# Patient Record
Sex: Female | Born: 1985 | Race: White | Hispanic: No | Marital: Single | Smoking: Current every day smoker
Health system: Southern US, Community
[De-identification: ages and names within clinical notes are randomized; demographics above are authoritative.]

---
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
## 2006-05-21 ENCOUNTER — Emergency Department: Payer: Self-pay | Admitting: Emergency Medicine

## 2006-10-03 ENCOUNTER — Observation Stay: Payer: Self-pay

## 2006-10-24 ENCOUNTER — Observation Stay: Payer: Self-pay | Admitting: Unknown Physician Specialty

## 2006-12-15 ENCOUNTER — Observation Stay: Payer: Self-pay

## 2006-12-26 ENCOUNTER — Inpatient Hospital Stay: Payer: Self-pay

## 2008-02-23 ENCOUNTER — Emergency Department: Payer: Self-pay | Admitting: Emergency Medicine

## 2009-06-24 ENCOUNTER — Emergency Department: Payer: Self-pay | Admitting: Emergency Medicine

## 2009-06-25 ENCOUNTER — Emergency Department: Payer: Self-pay | Admitting: Emergency Medicine

## 2009-12-17 ENCOUNTER — Emergency Department: Payer: Self-pay | Admitting: Emergency Medicine

## 2010-04-25 ENCOUNTER — Emergency Department: Payer: Self-pay | Admitting: Emergency Medicine

## 2010-04-26 ENCOUNTER — Emergency Department: Payer: Self-pay | Admitting: Unknown Physician Specialty

## 2010-07-23 ENCOUNTER — Ambulatory Visit: Payer: Self-pay | Admitting: Specialist

## 2012-01-19 ENCOUNTER — Emergency Department: Payer: Self-pay | Admitting: Emergency Medicine

## 2012-01-19 LAB — URINALYSIS, COMPLETE
Bilirubin,UR: NEGATIVE
Nitrite: NEGATIVE
Protein: NEGATIVE
Specific Gravity: 1.005 (ref 1.003–1.030)
Squamous Epithelial: 1
WBC UR: <1 /HPF (ref 0–5)

## 2012-01-19 LAB — CBC
HCT: 42.4 % (ref 35.0–47.0)
MCH: 31.6 pg (ref 26.0–34.0)
MCV: 92 fL (ref 80–100)
Platelet: 242 10*3/uL (ref 150–440)
RDW: 12.7 % (ref 11.5–14.5)

## 2012-01-19 LAB — HCG, QUANTITATIVE, PREGNANCY: Beta Hcg, Quant.: 3061 m[IU]/mL — ABNORMAL HIGH

## 2012-01-19 LAB — WET PREP, GENITAL

## 2012-01-20 ENCOUNTER — Emergency Department: Payer: Self-pay | Admitting: Emergency Medicine

## 2012-01-26 ENCOUNTER — Emergency Department: Payer: Self-pay | Admitting: Emergency Medicine

## 2012-01-26 LAB — URINALYSIS, COMPLETE
Bilirubin,UR: NEGATIVE
Blood: NEGATIVE
Ketone: NEGATIVE
Leukocyte Esterase: NEGATIVE
Nitrite: NEGATIVE
Ph: 7 (ref 4.5–8.0)
Specific Gravity: 1.012 (ref 1.003–1.030)

## 2012-01-26 LAB — COMPREHENSIVE METABOLIC PANEL
Albumin: 4.1 g/dL (ref 3.4–5.0)
Alkaline Phosphatase: 59 U/L (ref 50–136)
Anion Gap: 8 (ref 7–16)
Bilirubin,Total: 0.7 mg/dL (ref 0.2–1.0)
Chloride: 108 mmol/L — ABNORMAL HIGH (ref 98–107)
Co2: 24 mmol/L (ref 21–32)
Creatinine: 0.49 mg/dL — ABNORMAL LOW (ref 0.60–1.30)
EGFR (African American): >60
EGFR (Non-African Amer.): >60
Glucose: 86 mg/dL (ref 65–99)
Potassium: 3.8 mmol/L (ref 3.5–5.1)
Sodium: 140 mmol/L (ref 136–145)

## 2012-01-26 LAB — CBC
HCT: 41.4 % (ref 35.0–47.0)
HGB: 14.4 g/dL (ref 12.0–16.0)
MCH: 32.1 pg (ref 26.0–34.0)
MCHC: 34.7 g/dL (ref 32.0–36.0)
Platelet: 218 10*3/uL (ref 150–440)
RBC: 4.49 10*6/uL (ref 3.80–5.20)

## 2012-01-26 LAB — HCG, QUANTITATIVE, PREGNANCY: Beta Hcg, Quant.: 10971 m[IU]/mL — ABNORMAL HIGH

## 2012-01-26 LAB — WET PREP, GENITAL

## 2012-04-13 ENCOUNTER — Encounter: Payer: Self-pay | Admitting: Maternal & Fetal Medicine

## 2012-05-09 ENCOUNTER — Encounter: Payer: Self-pay | Admitting: Pediatric Cardiology

## 2012-06-05 ENCOUNTER — Encounter: Payer: Self-pay | Admitting: Maternal & Fetal Medicine

## 2012-07-17 ENCOUNTER — Encounter: Payer: Self-pay | Admitting: Obstetrics & Gynecology

## 2012-08-08 ENCOUNTER — Emergency Department: Payer: Self-pay | Admitting: Emergency Medicine

## 2012-08-21 ENCOUNTER — Encounter: Payer: Self-pay | Admitting: Maternal and Fetal Medicine

## 2012-08-31 ENCOUNTER — Inpatient Hospital Stay: Payer: Self-pay | Admitting: Obstetrics and Gynecology

## 2012-08-31 LAB — PIH PROFILE
Anion Gap: 9 (ref 7–16)
BUN: 9 mg/dL (ref 7–18)
Calcium, Total: 8.5 mg/dL (ref 8.5–10.1)
Chloride: 110 mmol/L — ABNORMAL HIGH (ref 98–107)
Co2: 19 mmol/L — ABNORMAL LOW (ref 21–32)
HCT: 33.9 % — ABNORMAL LOW (ref 35.0–47.0)
MCH: 29.2 pg (ref 26.0–34.0)
MCV: 84 fL (ref 80–100)
Osmolality: 273 (ref 275–301)
Platelet: 177 10*3/uL (ref 150–440)
Potassium: 3.8 mmol/L (ref 3.5–5.1)
RDW: 13.9 % (ref 11.5–14.5)
SGOT(AST): 21 U/L (ref 15–37)
Sodium: 138 mmol/L (ref 136–145)
Uric Acid: 4.4 mg/dL (ref 2.6–6.0)
WBC: 8.3 10*3/uL (ref 3.6–11.0)

## 2012-08-31 LAB — PROTEIN / CREATININE RATIO, URINE
Protein, Random Urine: 55 mg/dL — ABNORMAL HIGH (ref 0–12)
Protein/Creat. Ratio: 1368 mg/gCREAT — ABNORMAL HIGH (ref 0–200)

## 2012-09-01 LAB — PLATELET COUNT: Platelet: 164 10*3/uL (ref 150–440)

## 2012-09-02 LAB — HEMATOCRIT
HCT: 25.6 % — ABNORMAL LOW (ref 35.0–47.0)
HCT: 24.4 % — ABNORMAL LOW (ref 35.0–47.0)

## 2012-09-02 LAB — HEMOGLOBIN: HGB: 8.7 g/dL — ABNORMAL LOW (ref 12.0–16.0)

## 2012-09-04 LAB — PATHOLOGY REPORT

## 2012-09-07 ENCOUNTER — Observation Stay: Payer: Self-pay | Admitting: Obstetrics and Gynecology

## 2012-09-07 LAB — URINALYSIS, COMPLETE
Bacteria: NONE SEEN
Glucose,UR: >=500 mg/dL (ref 0–75)
Ketone: NEGATIVE
Leukocyte Esterase: NEGATIVE
Nitrite: NEGATIVE
Ph: 6 (ref 4.5–8.0)
Specific Gravity: 1.015 (ref 1.003–1.030)
Squamous Epithelial: <1

## 2012-09-07 LAB — COMPREHENSIVE METABOLIC PANEL
Alkaline Phosphatase: 154 U/L — ABNORMAL HIGH (ref 50–136)
Anion Gap: 11 (ref 7–16)
BUN: 11 mg/dL (ref 7–18)
Bilirubin,Total: 0.4 mg/dL (ref 0.2–1.0)
Chloride: 105 mmol/L (ref 98–107)
Co2: 20 mmol/L — ABNORMAL LOW (ref 21–32)
Creatinine: 0.46 mg/dL — ABNORMAL LOW (ref 0.60–1.30)
SGPT (ALT): 74 U/L (ref 12–78)
Total Protein: 7.9 g/dL (ref 6.4–8.2)

## 2012-09-07 LAB — CBC
HCT: 32 % — ABNORMAL LOW (ref 35.0–47.0)
HGB: 11 g/dL — ABNORMAL LOW (ref 12.0–16.0)
MCH: 28.8 pg (ref 26.0–34.0)
MCV: 84 fL (ref 80–100)
Platelet: 336 10*3/uL (ref 150–440)
RBC: 3.81 10*6/uL (ref 3.80–5.20)
RDW: 15.7 % — ABNORMAL HIGH (ref 11.5–14.5)
WBC: 9.1 10*3/uL (ref 3.6–11.0)

## 2014-12-31 NOTE — Op Note (Signed)
PATIENT NAME:  Theresa Hodges, Theresa Hodges MR#:  [id number] DATE OF BIRTH:  24-Jul-1986  DATE OF PROCEDURE:  09/01/2012  PREOPERATIVE DIAGNOSIS: Postpartum hemorrhage.  POSTOPERATIVE DIAGNOSIS: Postpartum hemorrhage, retained placenta.   PROCEDURE: Dilatation and curettage.   SURGEON: Senaida LangeLashawn Weaver-Lee, MD   ANESTHESIA: General.   ESTIMATED BLOOD LOSS: 500 mL.  OPERATIVE FLUIDS: 800 mL, 1 unit of packed red blood cells running.   COMPLICATIONS: None.   FINDINGS: As above.   SPECIMEN: Products of conception.   INDICATIONS: The patient is a 29 year old who is status post vaginal delivery with manual removal of placenta, who had postpartum hemorrhage shortly after this. She received Cytotec as well as Hemabate without improvement in her bleeding; therefore, it was decided to perform D and C. The risks, benefits, indications, and alternatives of the procedure were explained and informed consent was obtained.   PROCEDURE: The patient was taken to the operating room with IV fluids running. She was prepped and draped in the usual sterile fashion in candy cane stirrups. A speculum was placed inside the vagina. The anterior lip of the cervix was grasped with a single-tooth tenaculum. The cervix was evaluated for possible cervical lacerations, and none were noted. Her uterus was gently curettaged using both straight and serrated edge banjo curettes,  at which time placental tissue was removed. Once the bleeding decreased, her fundus was manually massaged and it was not seen to have a lot of bleeding nor clot. The procedure was ended at this point. All instrumentation was removed from the patient's vagina. Sponge and instrument counts were correct x 2. The patient was awakened from anesthesia and taken to the recovery room in stable condition.   ____________________________ Sonda PrimesLashawn A. Patton SallesWeaver-Lee, MD law:cb D: 09/01/2012 09:59:14 ET T: 09/01/2012 15:31:49 ET JOB#: [id number]  cc: Flint MelterLashawn A. Patton SallesWeaver-Lee, MD,  <Dictator> Janyth ContesLASHAWN A WEAVER LEE MD ELECTRONICALLY SIGNED 09/01/2012 19:14

## 2015-01-21 NOTE — H&P (Signed)
L&D Evaluation:  History:   HPI 29 yo G3 P3003 who is postpartum day #6 s/p NSVD.  She delivered at 7479w0d after being induced for mild preeclampsia.  Her delivery was complicated by a post-partum hemorrhage requiring D&C, as well as acute blood loss anemia requiring transfusion of 2 units of packed red blood cells and 2 units of fresh frozen plasma. She was discharged on postpartum day #2 on labetalol 200mg  po bid.   She presents today after having had a headache for the past couple of days that is unresponsive to any home treatments (she is taking percocet at home).  She denies taking blood pressure medicine at home.  She denies visual changes and right upper quadrant pain.  She has also had nausea and emesis since last night.    Patient's Medical History Herpes Simplex Virus    Patient's Surgical History LEEP    Medications Pre Natal Vitamins  Motrin (Ibuprofen)  percocet    Allergies Ultram    Social History none    Family History Non-Contributory   ROS:   ROS All systems were reviewed.  HEENT, CNS, GI, GU, Respiratory, CV, Renal and Musculoskeletal systems were found to be normal., unless noted in HPI   Exam:   Vital Signs BP >140/90  range in ER from SBP 140-170s/DBP 70s-90s    Urine Protein 2+    General mild-moderate distress    Mental Status clear    Chest clear    Heart normal sinus rhythm    Abdomen gravid, non-tender    Back no CVAT    Edema no edema    Reflexes 2+    Skin no lesions    Other HELLP labs negative   Impression:   Impression Preeclampsia, severe now delivered with worsening symptoms   Plan:   Comments Will admit for a short course of magnesium and observation.  Per her discharge instructions, she was discharged on labetalol and now states she is taking nothing.  She does have a headache refractory to treatment with BPs now in the 170s range.  Given these new findings, which suggest a severe form of disease will give neuroprotection for a  short course and then monitor in-house on labetalol.   Electronic Signatures: Conard NovakJackson, Keyston Ardolino D (MD)  (Signed 26-Dec-13 17:28)  Authored: L&D Evaluation   Last Updated: 26-Dec-13 17:28 by Conard NovakJackson, Wylie Coon D (MD)

## 2015-01-21 NOTE — H&P (Signed)
L&D Evaluation:  History:   HPI 29 yo G3 P2002 at 5624w0d gestational age by early 1st trimester ultrasound.  Pregnancy has been complicated by Rh neg status, history of LEEP, abnormal AFP screen with elevated Trisomy 21 risk (1:8).  She declined amniocentesis and cell-free fetal DNA testing.  Her fetus has had a normal echocardiogram. She has had serial growth ultrasounds by [facility], which have been normal.  She initially received her care at [facility]. [facility] at about [redacted] weeks gestation for new-onset elevated BPs for which she was started on labetalol.  Her BPs have been in hte normal range and she has had negative proteinuria in clinic until today.  Today in clinic she presented with BP 150/100 and 2+ proteinuria.  She was sent to L&D for further evaluation.  She denies HA, visual disturbances, RUQ pain.  She notes positive fetal movement, denies vaginal bleeding, contractions, and leakage of fluid. Blood type A neg, RPR NR, RI, HBsAg neg, pap smear = ASCUS w/ HPV neg, GBS POS, VZNI, she declined TDAP this pregnancy.    Patient's Medical History Herpes simplex virus    Patient's Surgical History LEEP    Medications labetalol 100mg  PO BID    Allergies Ultram    Social History none    Family History Non-Contributory   Exam:   Vital Signs SBPs 135-159 (all but one >140), DBPs 79-91    Urine Protein UPC ratio 1368    General no apparent distress    Mental Status clear    Chest clear    Heart normal sinus rhythm    Abdomen gravid, non-tender    Estimated Fetal Weight 6 lbs    Fetal Position vertex by bedside ultrasound performed by me    Back no CVAT    Edema 1+    Reflexes 2+    Pelvic no external lesions, 2/50/-3    Mebranes Intact    FHT normal rate with no decels    FHT Description 130/mod var/+accels/ no decels    Ucx irregular    Other HELLP Labs all within normal laboratory range   Impression:   Impression 1) Intrauterine  pregnancy at early term (37 wks), 2) mild preeclampsia   Plan:   Plan EFM/NST, antibiotics for GBBS prophylaxis, fluids    Comments - admit for induction for mild preeclampsia - magnesium sulfate in labor and 12-24 hours postpartum - discussed with patient. She is in agreement.   Electronic Signatures: Conard NovakJackson, Juliannah Ohmann D (MD)  (Signed 19-Dec-13 19:14)  Authored: L&D Evaluation   Last Updated: 19-Dec-13 19:14 by Conard NovakJackson, Bayli Quesinberry D (MD)

## 2015-11-14 ENCOUNTER — Ambulatory Visit
Admission: RE | Admit: 2015-11-14 | Discharge: 2015-11-14 | Disposition: A | Discharge status: Home / Self Care | Payer: 59 | Source: Ambulatory Visit | Attending: Family Medicine | Admitting: Family Medicine

## 2015-11-14 ENCOUNTER — Other Ambulatory Visit: Payer: Self-pay | Admitting: Family Medicine

## 2015-11-14 DIAGNOSIS — R1011 Right upper quadrant pain: Secondary | ICD-10-CM | POA: Diagnosis not present

## 2016-03-13 ENCOUNTER — Encounter: Payer: Self-pay | Admitting: Emergency Medicine

## 2016-03-13 ENCOUNTER — Emergency Department
Admission: EM | Admit: 2016-03-13 | Discharge: 2016-03-13 | Disposition: A | Discharge status: Home / Self Care | Payer: 59 | Attending: Emergency Medicine | Admitting: Emergency Medicine

## 2016-03-13 ENCOUNTER — Emergency Department: Payer: 59

## 2016-03-13 DIAGNOSIS — G43909 Migraine, unspecified, not intractable, without status migrainosus: Secondary | ICD-10-CM | POA: Diagnosis present

## 2016-03-13 DIAGNOSIS — F17210 Nicotine dependence, cigarettes, uncomplicated: Secondary | ICD-10-CM | POA: Diagnosis not present

## 2016-03-13 MED ORDER — KETOROLAC TROMETHAMINE 30 MG/ML IJ SOLN
30.0000 mg | Freq: Once | INTRAMUSCULAR | Status: AC
  Administered 2016-03-13: 30 mg via INTRAVENOUS
  Filled 2016-03-13: qty 1

## 2016-03-13 MED ORDER — METOCLOPRAMIDE HCL 5 MG/ML IJ SOLN
20.0000 mg | Freq: Once | INTRAMUSCULAR | Status: AC
  Administered 2016-03-13: 20 mg via INTRAVENOUS
  Filled 2016-03-13: qty 4

## 2016-03-13 MED ORDER — DIPHENHYDRAMINE HCL 50 MG/ML IJ SOLN
25.0000 mg | Freq: Once | INTRAMUSCULAR | Status: AC
  Administered 2016-03-13: 25 mg via INTRAVENOUS
  Filled 2016-03-13: qty 1

## 2016-03-13 MED ORDER — SODIUM CHLORIDE 0.9 % IV SOLN
1000.0000 mL | Freq: Once | INTRAVENOUS | Status: AC
  Administered 2016-03-13: 1000 mL via INTRAVENOUS

## 2016-03-13 MED ORDER — BUTALBITAL-APAP-CAFFEINE 50-325-40 MG PO TABS: 1.0000 | ORAL_TABLET | Freq: Four times a day (QID) | ORAL | Status: AC | PRN

## 2016-03-13 NOTE — ED Notes (Signed)
Pt. Going home with friend 

## 2016-03-13 NOTE — ED Provider Notes (Signed)
[facility] Emergency Department Provider Note  ____________________________________________    I have reviewed the triage vital signs and the nursing notes.   HISTORY  Chief Complaint Emesis    HPI Theresa Hodges is a 30 y.o. female who presents with complaints of a headache. Patient reports the headache started gradually at approximately noon today. It steadily became worse. She reports that when was on its worse she felt intermittent tingling in her distal left upper extremity. No weakness. She also notes that prior to the headache she noticed an area of bright lights in her right eye which she has never had before. She does not have history of migraines. No head trauma. No blood thinners. No fevers chills nausea vomiting. No recent travel. No neck pain.Positive photophobia     History reviewed. No pertinent past medical history.  There are no active problems to display for this patient.   History reviewed. No pertinent past surgical history.  No current outpatient prescriptions on file.  Allergies Review of patient's allergies indicates no known allergies.  History reviewed. No pertinent family history.  Social History Social History  Substance Use Topics  . Smoking status: Current Every Day Smoker -- 0.25 packs/day    Types: Cigarettes  . Smokeless tobacco: None  . Alcohol Use: No    Review of Systems  Constitutional: Negative for fever. Eyes: Negative for redness ENT: Negative for sore throat Cardiovascular: Negative for chest pain Respiratory: Negative for shortness of breath. Gastrointestinal: Negative for abdominal pain Genitourinary: Negative for dysuria. Musculoskeletal: Negative for back pain. Skin: Negative for rash. Neurological: Negative for focal weakness, Positive for headache Psychiatric: no anxiety    ____________________________________________   PHYSICAL EXAM:  VITAL SIGNS: ED Triage Vitals  Enc Vitals  Group     BP 03/13/16 1428 135/73 mmHg     Pulse Rate 03/13/16 1428 90     Resp 03/13/16 1428 18     Temp 03/13/16 1428 97.5 F (36.4 C)     Temp Source 03/13/16 1428 Oral     SpO2 03/13/16 1428 100 %     Weight 03/13/16 1428 180 lb (81.647 kg)     Height 03/13/16 1428 5\' 5"  (1.651 m)     Head Cir --      Peak Flow --      Pain Score 03/13/16 1428 8     Pain Loc --      Pain Edu? --      Excl. in GC? --      Constitutional: Alert and oriented. Well appearing and in no distress.  Eyes: Conjunctivae are normal. No erythema or injection, PERRLA, EOMI ENT   Head: Normocephalic and atraumatic.   Mouth/Throat: Mucous membranes are moist. Cardiovascular: Normal rate, regular rhythm. Normal and symmetric distal pulses are present in the upper extremities.  Respiratory: Normal respiratory effort without tachypnea nor retractions. Breath sounds are clear and equal bilaterally.  Gastrointestinal: Soft and non-tender in all quadrants. No distention. There is no CVA tenderness. Genitourinary: deferred Musculoskeletal: Nontender with normal range of motion in all extremities. No lower extremity tenderness nor edema. Neurologic:  Normal speech and language. No gross focal neurologic deficits are appreciated. Cranial nerves II-12 are normal Skin:  Skin is warm, dry and intact. No rash noted. Psychiatric: Mood and affect are normal. Patient exhibits appropriate insight and judgment.  ____________________________________________    LABS (pertinent positives/negatives)  Labs Reviewed - No data to display  ____________________________________________   EKG  None  ____________________________________________  RADIOLOGY  CT head unremarkable  ____________________________________________   PROCEDURES  Procedure(s) performed: none  Critical Care performed: none  ____________________________________________   INITIAL IMPRESSION / ASSESSMENT AND PLAN / ED  COURSE  Pertinent labs & imaging results that were available during my care of the patient were reviewed by me and considered in my medical decision making (see chart for details).  Patient's presentation is strongly consistent with likely migraine given description of possible visual aura, photophobia, nausea. She has no fevers or chills. We will obtain CT head and give migraine cocktail. She reports she is not pregnant as recent menstrual cycle.  ----------------------------------------- 7:20 PM on 03/13/2016 -----------------------------------------  I went to check on the patient and she had gotten dressed and was sitting on the bench watching TV, she reports she feels "completely better". We discussed the possible causes of her headache, and my suspicion of a migraine given her age, likely aura and photophobia. We did discuss other options and the need for prompt return if  new symptoms. I have referred her to follow up with neurology. She is very comfortable with and agrees with this plan  ____________________________________________   FINAL CLINICAL IMPRESSION(S) / ED DIAGNOSES  Final diagnoses:  Migraine without status migrainosus, not intractable, unspecified migraine type          Jene Everyobert Dazaria Macneill, MD 03/13/16 Ernestina [id number]

## 2016-03-13 NOTE — ED Notes (Signed)
Pt. States intermittent numbness to lower upper lt. Extremity.  Pt. Currently states lt. Arm feels ok.

## 2016-03-13 NOTE — ED Notes (Signed)
Pt reports vomiting while at work x4. Prior to vomiting episodes patient states her left arm was numb off and on.

## 2016-03-13 NOTE — Discharge Instructions (Signed)

## 2017-05-06 IMAGING — CT CT HEAD W/O CM
3 series · 16 of 47 positions shown, 19 images · non-contrast
Comparison: None.

CLINICAL DATA: Headaches

EXAM:
CT HEAD WITHOUT CONTRAST
TECHNIQUE: Contiguous axial images were obtained from the base of the skull
through the vertex without intravenous contrast.

[Series 2: head wo · axial · 0.47mm/px · z∈[-190,-66]mm · 10 of 31 slices shown, 13 images]
[im 3/31  brain]
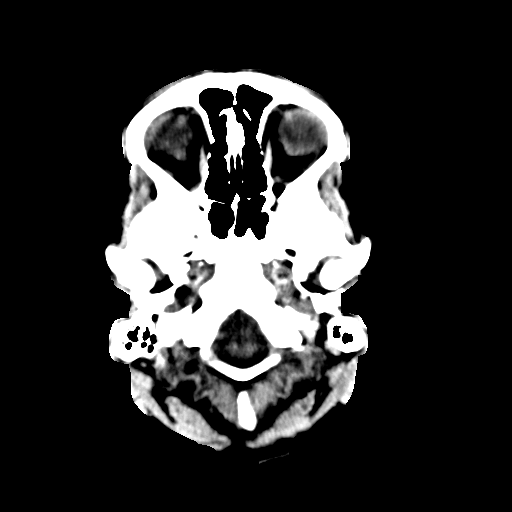
[im 3/31  bone]
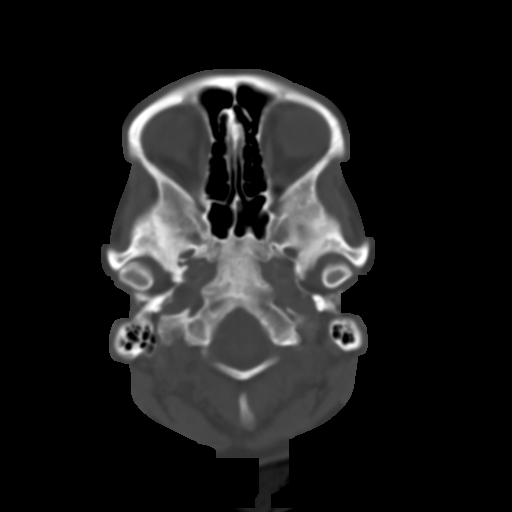
[im 6/31  brain]
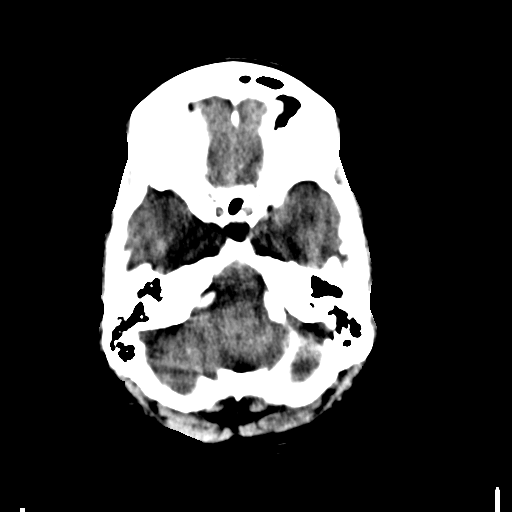
[im 9/31  brain]
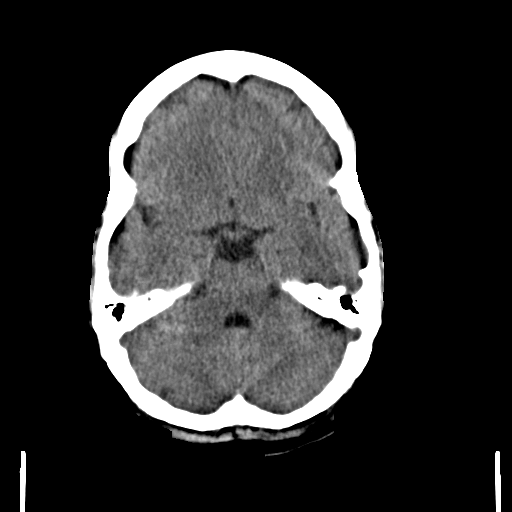
[im 11/31  brain]
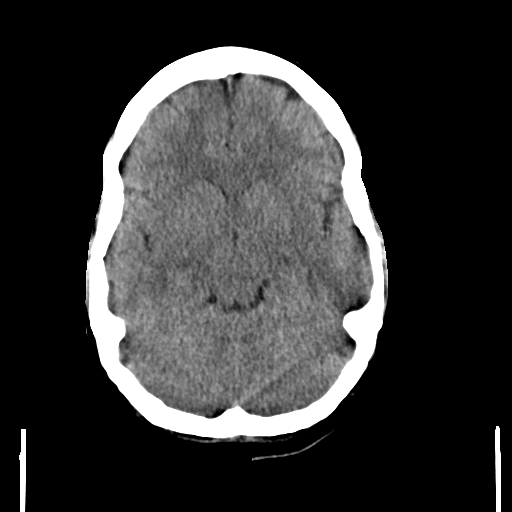
[im 14/31  brain]
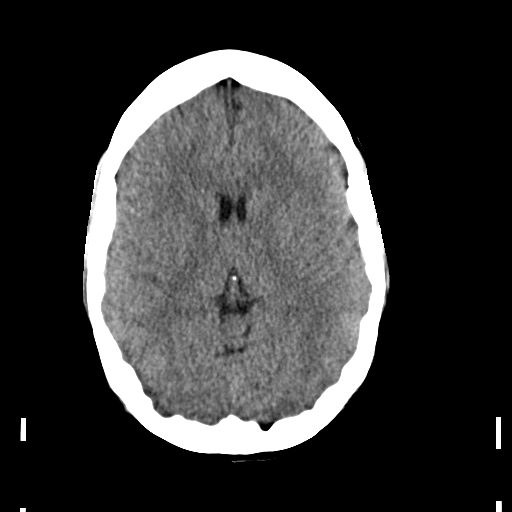
[im 14/31  bone]
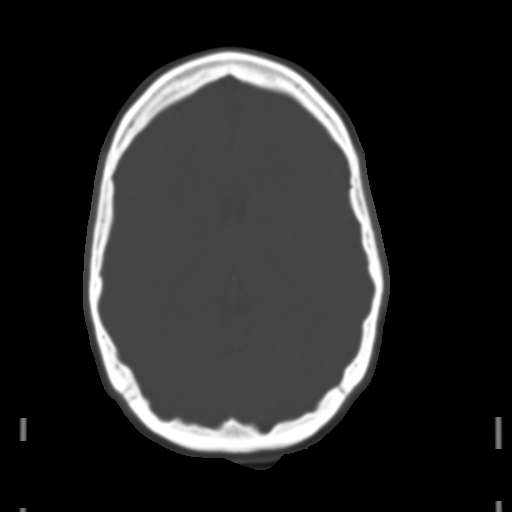
[im 17/31  brain]
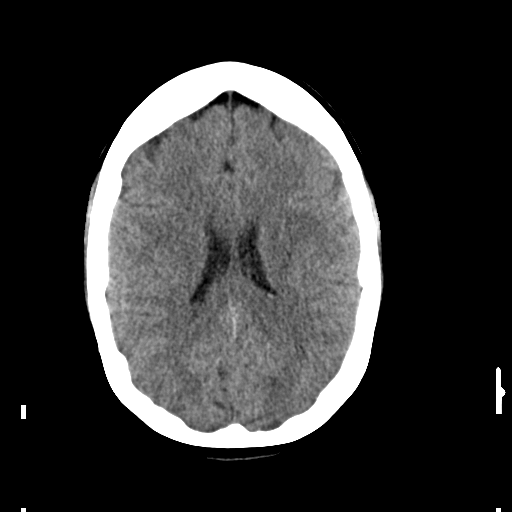
[im 20/31  brain]
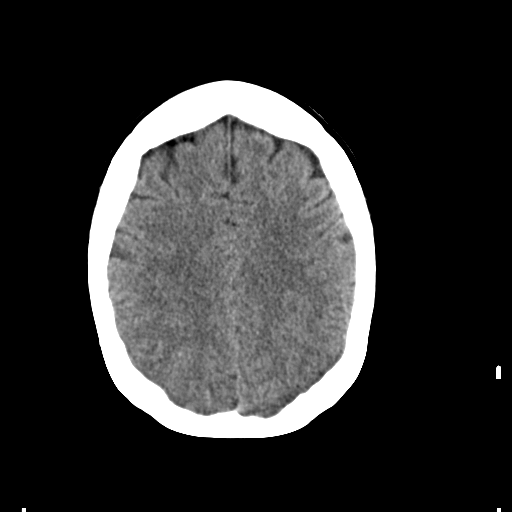
[im 23/31  brain]
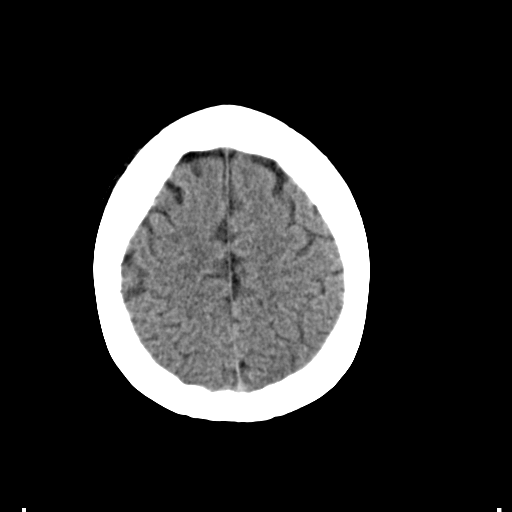
[im 25/31  brain]
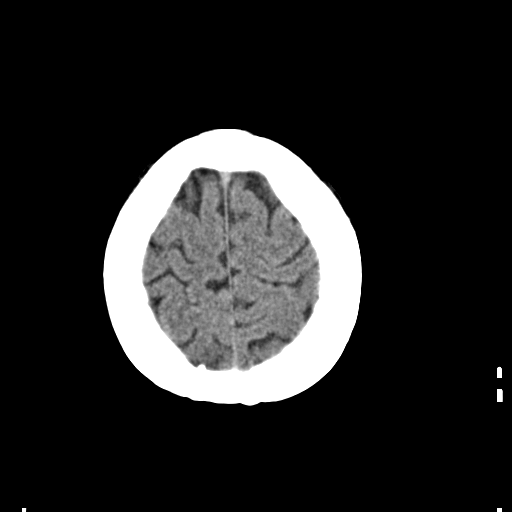
[im 25/31  bone]
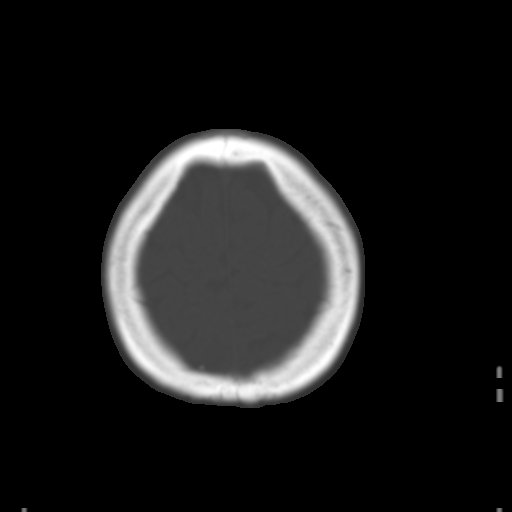
[im 28/31  brain]
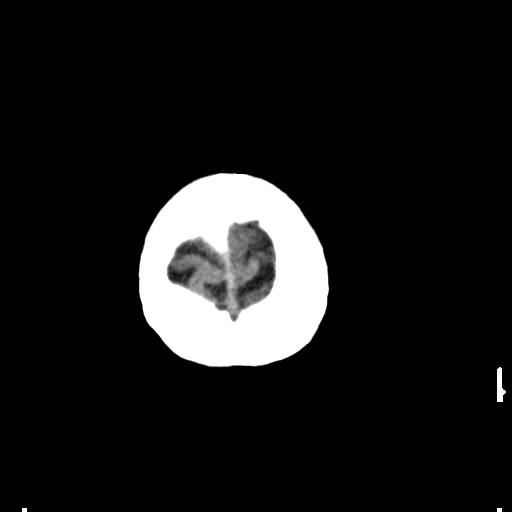

[Series 4: coronal soft · coronal · 0.29mm/px · 3 of 67 slices shown]
[im 23/67  brain]
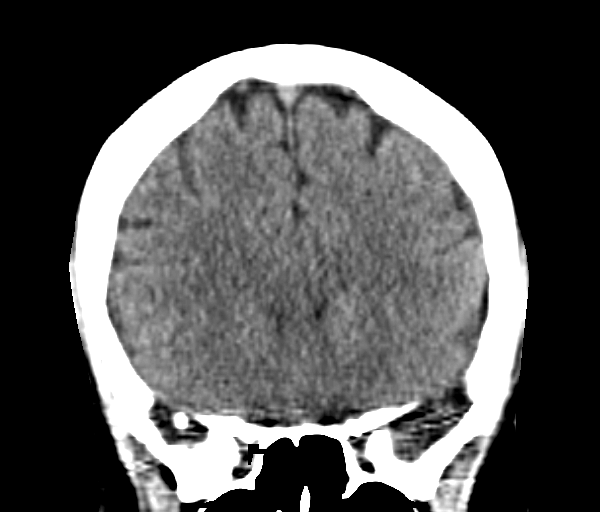
[im 30/67  brain]
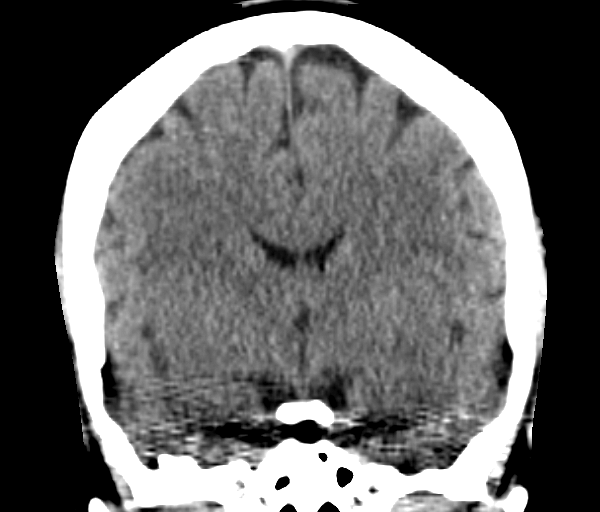
[im 37/67  brain]
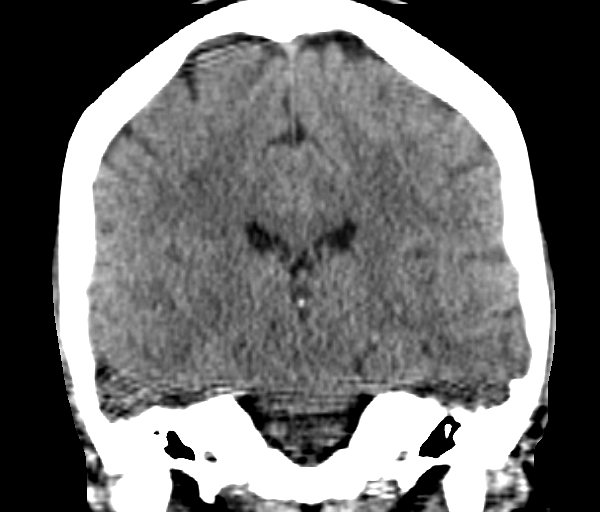

[Series 5: sagittal soft · sagittal · 0.30mm/px · 3 of 51 slices shown]
[im 17/51  brain]
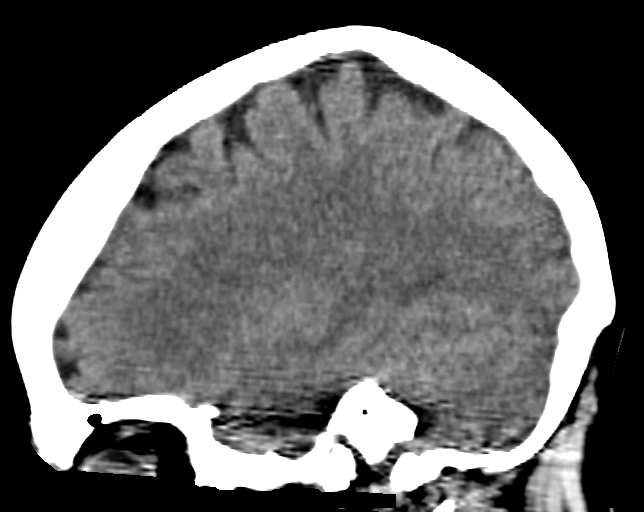
[im 26/51  brain]
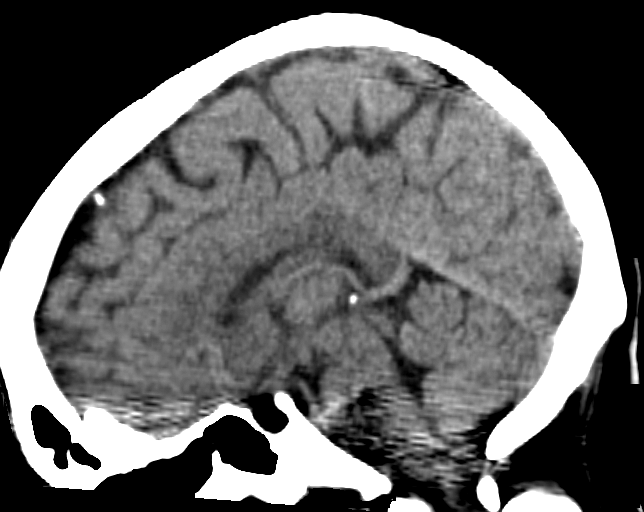
[im 34/51  brain]
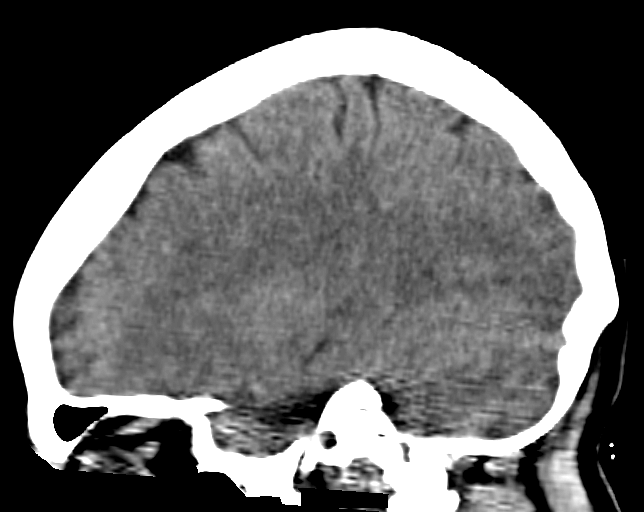

[16 of 47 positions shown; findings below may reference images not displayed]

FINDINGS: The bony calvarium is intact. The ventricles are of normal size and
configuration. No findings to suggest acute hemorrhage, acute
infarction or space-occupying mass lesion are noted.
IMPRESSION: No acute intracranial abnormality noted.

## 2022-05-24 ENCOUNTER — Ambulatory Visit: Payer: Self-pay

## 2022-11-22 ENCOUNTER — Ambulatory Visit: Payer: Self-pay
